# Patient Record
Sex: Female | Born: 1940
Health system: Southern US, Community
[De-identification: ages and names within clinical notes are randomized; demographics above are authoritative.]

## PROBLEM LIST (undated history)

## (undated) DIAGNOSIS — M1990 Unspecified osteoarthritis, unspecified site: Secondary | ICD-10-CM

## (undated) DIAGNOSIS — K8020 Calculus of gallbladder without cholecystitis without obstruction: Secondary | ICD-10-CM

## (undated) DIAGNOSIS — R319 Hematuria, unspecified: Secondary | ICD-10-CM

## (undated) DIAGNOSIS — Z860100 Personal history of colon polyps, unspecified: Secondary | ICD-10-CM

## (undated) DIAGNOSIS — K5790 Diverticulosis of intestine, part unspecified, without perforation or abscess without bleeding: Secondary | ICD-10-CM

## (undated) DIAGNOSIS — T7840XA Allergy, unspecified, initial encounter: Secondary | ICD-10-CM

## (undated) DIAGNOSIS — R112 Nausea with vomiting, unspecified: Secondary | ICD-10-CM

## (undated) DIAGNOSIS — H269 Unspecified cataract: Secondary | ICD-10-CM

## (undated) DIAGNOSIS — I10 Essential (primary) hypertension: Secondary | ICD-10-CM

## (undated) DIAGNOSIS — E041 Nontoxic single thyroid nodule: Secondary | ICD-10-CM

## (undated) DIAGNOSIS — Z9889 Other specified postprocedural states: Secondary | ICD-10-CM

## (undated) HISTORY — PX: TONSILLECTOMY: SUR1361

## (undated) HISTORY — DX: Unspecified cataract: H26.9

## (undated) HISTORY — PX: COLONOSCOPY: SHX174

## (undated) HISTORY — DX: Personal history of colon polyps, unspecified: Z86.0100

## (undated) HISTORY — PX: PARTIAL HYSTERECTOMY: SHX80

## (undated) HISTORY — DX: Calculus of gallbladder without cholecystitis without obstruction: K80.20

## (undated) HISTORY — DX: Nausea with vomiting, unspecified: R11.2

## (undated) HISTORY — DX: Diverticulosis of intestine, part unspecified, without perforation or abscess without bleeding: K57.90

## (undated) HISTORY — DX: Essential (primary) hypertension: I10

## (undated) HISTORY — PX: POLYPECTOMY: SHX149

## (undated) HISTORY — DX: Hematuria, unspecified: R31.9

## (undated) HISTORY — DX: Unspecified osteoarthritis, unspecified site: M19.90

## (undated) HISTORY — DX: Other specified postprocedural states: Z98.890

## (undated) HISTORY — DX: Allergy, unspecified, initial encounter: T78.40XA

## (undated) HISTORY — DX: Nontoxic single thyroid nodule: E04.1

## (undated) HISTORY — DX: Personal history of colonic polyps: Z86.010

## (undated) HISTORY — PX: TUBAL LIGATION: SHX77

---

## 1998-08-09 ENCOUNTER — Ambulatory Visit (HOSPITAL_COMMUNITY): Admission: RE | Admit: 1998-08-09 | Discharge: 1998-08-09 | Payer: Self-pay | Admitting: Family Medicine

## 1998-08-09 ENCOUNTER — Encounter: Payer: Self-pay | Admitting: Family Medicine

## 1999-08-16 ENCOUNTER — Encounter: Payer: Self-pay | Admitting: Family Medicine

## 1999-08-16 ENCOUNTER — Ambulatory Visit (HOSPITAL_COMMUNITY): Admission: RE | Admit: 1999-08-16 | Discharge: 1999-08-16 | Payer: Self-pay | Admitting: Family Medicine

## 2000-11-04 ENCOUNTER — Encounter: Payer: Self-pay | Admitting: Family Medicine

## 2000-11-04 ENCOUNTER — Ambulatory Visit (HOSPITAL_COMMUNITY): Admission: RE | Admit: 2000-11-04 | Discharge: 2000-11-04 | Payer: Self-pay | Admitting: Family Medicine

## 2001-11-17 ENCOUNTER — Encounter: Payer: Self-pay | Admitting: Internal Medicine

## 2001-11-17 ENCOUNTER — Ambulatory Visit (HOSPITAL_COMMUNITY): Admission: RE | Admit: 2001-11-17 | Discharge: 2001-11-17 | Payer: Self-pay | Admitting: Internal Medicine

## 2002-11-23 ENCOUNTER — Ambulatory Visit (HOSPITAL_COMMUNITY): Admission: RE | Admit: 2002-11-23 | Discharge: 2002-11-23 | Payer: Self-pay | Admitting: Family Medicine

## 2003-07-19 ENCOUNTER — Other Ambulatory Visit: Admission: RE | Admit: 2003-07-19 | Discharge: 2003-07-19 | Payer: Self-pay | Admitting: Obstetrics and Gynecology

## 2003-10-15 ENCOUNTER — Encounter (INDEPENDENT_AMBULATORY_CARE_PROVIDER_SITE_OTHER): Payer: Self-pay | Admitting: Specialist

## 2003-10-15 ENCOUNTER — Ambulatory Visit (HOSPITAL_COMMUNITY): Admission: RE | Admit: 2003-10-15 | Discharge: 2003-10-15 | Payer: Self-pay | Admitting: Gastroenterology

## 2004-01-05 ENCOUNTER — Ambulatory Visit (HOSPITAL_COMMUNITY): Admission: RE | Admit: 2004-01-05 | Discharge: 2004-01-05 | Payer: Self-pay | Admitting: Obstetrics and Gynecology

## 2004-11-14 ENCOUNTER — Other Ambulatory Visit: Admission: RE | Admit: 2004-11-14 | Discharge: 2004-11-14 | Payer: Self-pay | Admitting: Obstetrics and Gynecology

## 2005-02-05 ENCOUNTER — Ambulatory Visit (HOSPITAL_COMMUNITY): Admission: RE | Admit: 2005-02-05 | Discharge: 2005-02-05 | Payer: Self-pay | Admitting: Obstetrics and Gynecology

## 2006-03-04 ENCOUNTER — Ambulatory Visit (HOSPITAL_COMMUNITY): Admission: RE | Admit: 2006-03-04 | Discharge: 2006-03-04 | Payer: Self-pay | Admitting: Allergy

## 2007-06-04 ENCOUNTER — Ambulatory Visit (HOSPITAL_COMMUNITY): Admission: RE | Admit: 2007-06-04 | Discharge: 2007-06-04 | Payer: Self-pay | Admitting: Allergy

## 2010-06-09 NOTE — Op Note (Signed)
Sheryl Flores, SHEARMAN NO.:  [id number]   MEDICAL RECORD NO.:  [id number]          PATIENT TYPE:  AMB   LOCATION:  ENDO                         FACILITY:  [facility]   PHYSICIAN:  Petra Kuba, M.D.    DATE OF BIRTH:  11/16/1940   DATE OF PROCEDURE:  10/15/2003  DATE OF DISCHARGE:                                 OPERATIVE REPORT   PROCEDURE:  Colonoscopy with polypectomy.   INDICATION:  The patient with a family history of colon cancer in the  maternal aunt.  Her mom with some sort of abdominal cancer.  Consent was  signed after risks, benefits, methods, and options thoroughly discussed in  the office.   MEDICINES USED:  1.  Demerol 60.  2.  Versed 6.   DESCRIPTION OF PROCEDURE:  Rectal inspection was pertinent for external  hemorrhoids, small.  Digital exam was negative.  Video pediatric adjustable  colonoscope was inserted and with abdominal pressure, easily able to be  advanced around the colon to the cecum.  This did require abdominal  pressure, as mentioned above.  Prep was adequate.  On insertion, some left-  sided diverticula were seen but no other abnormalities.  The cecum was  identified by the appendiceal orifice.  In fact, the scope was inserted a  short ways in the terminal ileum which was normal.  Photodocumentation was  obtained.  The scope was slowly withdrawn.  Prep was adequate.  There was  some liquid stool that required washing and suctioning.  The cecum and the  ascending were normal.  In the more proximal transverse, a tiny polyp was  seen and was hot biopsied x 1; the scope was further withdrawn.  Other than  the left-sided moderate diverticula, no abnormalities were seen.  Anorectal  pull-through and retroflexion did confirm some small hemorrhoids.  The scope  was reinserted a short ways up the left side of the colon; air was suctioned  and scope removed.  The patient tolerated the procedure well.  There was no  obvious immediate  complication.   ENDOSCOPIC DIAGNOSES:  1.  Internal-external hemorrhoids.  2.  Left-sided moderate diverticula.  3.  Proximal tiny transverse polyp, hot biopsied.  4.  Otherwise, within normal limits to the terminal ileum.   PLAN:  1.  Await pathology to determine future colonic screening.  2.  Probably recheck in 5 years.  3.  Happy to see back p.r.n.  4.  Otherwise, return care to Dr. Dareen Piano for the customary health care      maintenance to include yearly rectals and guaiacs.      MEM/MEDQ  D:  10/15/2003  T:  10/15/2003  Job:  [id number]   cc:   Malva Limes, M.D.  [address], [address]  [place]  [place] [address]  Fax: [phone]

## 2012-10-13 ENCOUNTER — Other Ambulatory Visit: Payer: Self-pay | Admitting: Urology

## 2012-10-13 ENCOUNTER — Ambulatory Visit
Admission: RE | Admit: 2012-10-13 | Discharge: 2012-10-13 | Disposition: A | Discharge status: Home / Self Care | Payer: Medicare Other | Source: Ambulatory Visit | Attending: Urology | Admitting: Urology

## 2012-10-13 DIAGNOSIS — R319 Hematuria, unspecified: Secondary | ICD-10-CM

## 2015-03-18 DIAGNOSIS — J019 Acute sinusitis, unspecified: Secondary | ICD-10-CM | POA: Diagnosis not present

## 2015-03-18 DIAGNOSIS — J302 Other seasonal allergic rhinitis: Secondary | ICD-10-CM | POA: Diagnosis not present

## 2015-03-18 DIAGNOSIS — H6123 Impacted cerumen, bilateral: Secondary | ICD-10-CM | POA: Diagnosis not present

## 2015-03-18 DIAGNOSIS — H6983 Other specified disorders of Eustachian tube, bilateral: Secondary | ICD-10-CM | POA: Diagnosis not present

## 2015-05-12 DIAGNOSIS — Z0000 Encounter for general adult medical examination without abnormal findings: Secondary | ICD-10-CM | POA: Diagnosis not present

## 2015-05-12 DIAGNOSIS — I10 Essential (primary) hypertension: Secondary | ICD-10-CM | POA: Diagnosis not present

## 2015-05-12 DIAGNOSIS — E042 Nontoxic multinodular goiter: Secondary | ICD-10-CM | POA: Diagnosis not present

## 2015-05-12 DIAGNOSIS — R030 Elevated blood-pressure reading, without diagnosis of hypertension: Secondary | ICD-10-CM | POA: Diagnosis not present

## 2015-05-12 DIAGNOSIS — Z1389 Encounter for screening for other disorder: Secondary | ICD-10-CM | POA: Diagnosis not present

## 2015-06-02 DIAGNOSIS — Z79899 Other long term (current) drug therapy: Secondary | ICD-10-CM | POA: Diagnosis not present

## 2015-06-02 DIAGNOSIS — I10 Essential (primary) hypertension: Secondary | ICD-10-CM | POA: Diagnosis not present

## 2015-06-21 DIAGNOSIS — J0100 Acute maxillary sinusitis, unspecified: Secondary | ICD-10-CM | POA: Diagnosis not present

## 2015-06-21 DIAGNOSIS — J40 Bronchitis, not specified as acute or chronic: Secondary | ICD-10-CM | POA: Diagnosis not present

## 2015-07-04 DIAGNOSIS — Z79899 Other long term (current) drug therapy: Secondary | ICD-10-CM | POA: Diagnosis not present

## 2015-07-04 DIAGNOSIS — J029 Acute pharyngitis, unspecified: Secondary | ICD-10-CM | POA: Diagnosis not present

## 2015-07-04 DIAGNOSIS — I10 Essential (primary) hypertension: Secondary | ICD-10-CM | POA: Diagnosis not present

## 2015-07-05 DIAGNOSIS — D485 Neoplasm of uncertain behavior of skin: Secondary | ICD-10-CM | POA: Diagnosis not present

## 2015-07-05 DIAGNOSIS — L820 Inflamed seborrheic keratosis: Secondary | ICD-10-CM | POA: Diagnosis not present

## 2015-07-05 DIAGNOSIS — D225 Melanocytic nevi of trunk: Secondary | ICD-10-CM | POA: Diagnosis not present

## 2015-07-19 DIAGNOSIS — D485 Neoplasm of uncertain behavior of skin: Secondary | ICD-10-CM | POA: Diagnosis not present

## 2015-08-16 DIAGNOSIS — Z79899 Other long term (current) drug therapy: Secondary | ICD-10-CM | POA: Diagnosis not present

## 2015-09-13 DIAGNOSIS — E876 Hypokalemia: Secondary | ICD-10-CM | POA: Diagnosis not present

## 2015-09-23 DIAGNOSIS — I10 Essential (primary) hypertension: Secondary | ICD-10-CM | POA: Diagnosis not present

## 2015-09-23 DIAGNOSIS — E876 Hypokalemia: Secondary | ICD-10-CM | POA: Diagnosis not present

## 2015-09-23 DIAGNOSIS — H6123 Impacted cerumen, bilateral: Secondary | ICD-10-CM | POA: Diagnosis not present

## 2015-10-03 DIAGNOSIS — L821 Other seborrheic keratosis: Secondary | ICD-10-CM | POA: Diagnosis not present

## 2015-10-03 DIAGNOSIS — D1801 Hemangioma of skin and subcutaneous tissue: Secondary | ICD-10-CM | POA: Diagnosis not present

## 2015-10-13 DIAGNOSIS — Z79899 Other long term (current) drug therapy: Secondary | ICD-10-CM | POA: Diagnosis not present

## 2015-11-03 DIAGNOSIS — I10 Essential (primary) hypertension: Secondary | ICD-10-CM | POA: Diagnosis not present

## 2015-11-03 DIAGNOSIS — Z79899 Other long term (current) drug therapy: Secondary | ICD-10-CM | POA: Diagnosis not present

## 2015-12-01 DIAGNOSIS — Z1231 Encounter for screening mammogram for malignant neoplasm of breast: Secondary | ICD-10-CM | POA: Diagnosis not present

## 2016-01-25 DIAGNOSIS — H2513 Age-related nuclear cataract, bilateral: Secondary | ICD-10-CM | POA: Diagnosis not present

## 2016-01-25 DIAGNOSIS — H524 Presbyopia: Secondary | ICD-10-CM | POA: Diagnosis not present

## 2016-02-02 DIAGNOSIS — J0100 Acute maxillary sinusitis, unspecified: Secondary | ICD-10-CM | POA: Diagnosis not present

## 2016-02-02 DIAGNOSIS — I10 Essential (primary) hypertension: Secondary | ICD-10-CM | POA: Diagnosis not present

## 2016-02-02 DIAGNOSIS — Z79899 Other long term (current) drug therapy: Secondary | ICD-10-CM | POA: Diagnosis not present

## 2016-05-08 DIAGNOSIS — L820 Inflamed seborrheic keratosis: Secondary | ICD-10-CM | POA: Diagnosis not present

## 2016-06-06 DIAGNOSIS — Z1389 Encounter for screening for other disorder: Secondary | ICD-10-CM | POA: Diagnosis not present

## 2016-06-06 DIAGNOSIS — E2839 Other primary ovarian failure: Secondary | ICD-10-CM | POA: Diagnosis not present

## 2016-06-06 DIAGNOSIS — Z23 Encounter for immunization: Secondary | ICD-10-CM | POA: Diagnosis not present

## 2016-06-06 DIAGNOSIS — Z0000 Encounter for general adult medical examination without abnormal findings: Secondary | ICD-10-CM | POA: Diagnosis not present

## 2016-06-12 DIAGNOSIS — E042 Nontoxic multinodular goiter: Secondary | ICD-10-CM | POA: Diagnosis not present

## 2016-06-12 DIAGNOSIS — Z79899 Other long term (current) drug therapy: Secondary | ICD-10-CM | POA: Diagnosis not present

## 2016-06-12 DIAGNOSIS — I10 Essential (primary) hypertension: Secondary | ICD-10-CM | POA: Diagnosis not present

## 2016-06-12 DIAGNOSIS — E781 Pure hyperglyceridemia: Secondary | ICD-10-CM | POA: Diagnosis not present

## 2016-06-29 DIAGNOSIS — R300 Dysuria: Secondary | ICD-10-CM | POA: Diagnosis not present

## 2016-07-02 DIAGNOSIS — R3121 Asymptomatic microscopic hematuria: Secondary | ICD-10-CM | POA: Diagnosis not present

## 2016-07-06 DIAGNOSIS — R945 Abnormal results of liver function studies: Secondary | ICD-10-CM | POA: Diagnosis not present

## 2016-07-06 DIAGNOSIS — R748 Abnormal levels of other serum enzymes: Secondary | ICD-10-CM | POA: Diagnosis not present

## 2016-07-10 DIAGNOSIS — R3121 Asymptomatic microscopic hematuria: Secondary | ICD-10-CM | POA: Diagnosis not present

## 2016-07-10 DIAGNOSIS — N281 Cyst of kidney, acquired: Secondary | ICD-10-CM | POA: Diagnosis not present

## 2016-07-13 DIAGNOSIS — N281 Cyst of kidney, acquired: Secondary | ICD-10-CM | POA: Diagnosis not present

## 2016-07-13 DIAGNOSIS — R3121 Asymptomatic microscopic hematuria: Secondary | ICD-10-CM | POA: Diagnosis not present

## 2016-07-30 DIAGNOSIS — Z79899 Other long term (current) drug therapy: Secondary | ICD-10-CM | POA: Diagnosis not present

## 2016-08-03 DIAGNOSIS — J0140 Acute pansinusitis, unspecified: Secondary | ICD-10-CM | POA: Diagnosis not present

## 2016-08-24 DIAGNOSIS — R198 Other specified symptoms and signs involving the digestive system and abdomen: Secondary | ICD-10-CM | POA: Diagnosis not present

## 2016-08-24 DIAGNOSIS — K760 Fatty (change of) liver, not elsewhere classified: Secondary | ICD-10-CM | POA: Diagnosis not present

## 2016-08-24 DIAGNOSIS — J069 Acute upper respiratory infection, unspecified: Secondary | ICD-10-CM | POA: Diagnosis not present

## 2016-11-28 DIAGNOSIS — H6123 Impacted cerumen, bilateral: Secondary | ICD-10-CM | POA: Diagnosis not present

## 2016-11-28 DIAGNOSIS — Z79899 Other long term (current) drug therapy: Secondary | ICD-10-CM | POA: Diagnosis not present

## 2016-11-28 DIAGNOSIS — E876 Hypokalemia: Secondary | ICD-10-CM | POA: Diagnosis not present

## 2016-11-28 DIAGNOSIS — I10 Essential (primary) hypertension: Secondary | ICD-10-CM | POA: Diagnosis not present

## 2016-12-11 DIAGNOSIS — J0100 Acute maxillary sinusitis, unspecified: Secondary | ICD-10-CM | POA: Diagnosis not present

## 2016-12-18 DIAGNOSIS — L820 Inflamed seborrheic keratosis: Secondary | ICD-10-CM | POA: Diagnosis not present

## 2017-01-25 DIAGNOSIS — J324 Chronic pansinusitis: Secondary | ICD-10-CM | POA: Diagnosis not present

## 2017-02-12 DIAGNOSIS — R05 Cough: Secondary | ICD-10-CM | POA: Diagnosis not present

## 2017-02-25 DIAGNOSIS — H52223 Regular astigmatism, bilateral: Secondary | ICD-10-CM | POA: Diagnosis not present

## 2017-02-25 DIAGNOSIS — H2513 Age-related nuclear cataract, bilateral: Secondary | ICD-10-CM | POA: Diagnosis not present

## 2017-03-04 DIAGNOSIS — R05 Cough: Secondary | ICD-10-CM | POA: Diagnosis not present

## 2017-03-04 DIAGNOSIS — E042 Nontoxic multinodular goiter: Secondary | ICD-10-CM | POA: Diagnosis not present

## 2017-03-04 DIAGNOSIS — Z79899 Other long term (current) drug therapy: Secondary | ICD-10-CM | POA: Diagnosis not present

## 2017-03-04 DIAGNOSIS — H6123 Impacted cerumen, bilateral: Secondary | ICD-10-CM | POA: Diagnosis not present

## 2017-03-04 DIAGNOSIS — R0989 Other specified symptoms and signs involving the circulatory and respiratory systems: Secondary | ICD-10-CM | POA: Diagnosis not present

## 2017-03-04 DIAGNOSIS — R0982 Postnasal drip: Secondary | ICD-10-CM | POA: Diagnosis not present

## 2017-03-04 DIAGNOSIS — K760 Fatty (change of) liver, not elsewhere classified: Secondary | ICD-10-CM | POA: Diagnosis not present

## 2017-03-04 DIAGNOSIS — I10 Essential (primary) hypertension: Secondary | ICD-10-CM | POA: Diagnosis not present

## 2017-03-04 DIAGNOSIS — E781 Pure hyperglyceridemia: Secondary | ICD-10-CM | POA: Diagnosis not present

## 2017-04-03 ENCOUNTER — Encounter: Payer: Self-pay | Admitting: Gastroenterology

## 2017-04-16 DIAGNOSIS — Z1231 Encounter for screening mammogram for malignant neoplasm of breast: Secondary | ICD-10-CM | POA: Diagnosis not present

## 2017-04-24 ENCOUNTER — Other Ambulatory Visit: Payer: Self-pay

## 2017-04-24 ENCOUNTER — Ambulatory Visit (AMBULATORY_SURGERY_CENTER): Payer: Self-pay | Admitting: *Deleted

## 2017-04-24 VITALS — Ht 63.5 in | Wt 146.0 lb

## 2017-04-24 DIAGNOSIS — Z86010 Personal history of colonic polyps: Secondary | ICD-10-CM

## 2017-04-24 MED ORDER — SOD PICOSULFATE-MAG OX-CIT ACD 10-3.5-12 MG-GM -GM/160ML PO SOLN: 2.0000 | ORAL | 0 refills | Status: DC

## 2017-04-24 NOTE — Progress Notes (Signed)
No egg or soy allergy known to patient  No issues with past sedation with any surgeries  or procedures, no intubation problems  No diet pills per patient No home 02 use per patient  No blood thinners per patient  Pt denies issues with constipation  No A fib or A flutter  EMMI video sent to pt's e mail =- pt declined  Clenpiq sample  Lot [id number] exp 05-2017 as directed

## 2017-04-26 ENCOUNTER — Encounter: Payer: Self-pay | Admitting: Gastroenterology

## 2017-05-08 ENCOUNTER — Ambulatory Visit (AMBULATORY_SURGERY_CENTER): Payer: PPO | Admitting: Gastroenterology

## 2017-05-08 ENCOUNTER — Encounter: Payer: Self-pay | Admitting: Gastroenterology

## 2017-05-08 ENCOUNTER — Other Ambulatory Visit: Payer: Self-pay

## 2017-05-08 VITALS — BP 136/76 | HR 78 | Temp 99.1°F | Resp 16 | Ht 63.0 in | Wt 146.0 lb

## 2017-05-08 DIAGNOSIS — Z86010 Personal history of colonic polyps: Secondary | ICD-10-CM

## 2017-05-08 DIAGNOSIS — I10 Essential (primary) hypertension: Secondary | ICD-10-CM | POA: Diagnosis not present

## 2017-05-08 MED ORDER — SODIUM CHLORIDE 0.9 % IV SOLN: 500.0000 mL | Freq: Once | INTRAVENOUS | Status: AC

## 2017-05-08 NOTE — Progress Notes (Signed)
Report to PACU, RN, vss, BBS= Clear.  

## 2017-05-08 NOTE — Progress Notes (Signed)
Pt's states no medical or surgical changes since previsit or office visit. 

## 2017-05-08 NOTE — Op Note (Signed)
[facility] Patient Name: Sheryl Flores Procedure Date: 05/08/2017 11:16 AM MRN: [id number] Endoscopist: Jackquline Denmark MD, MD Age: 77 Referring MD:  Date of Birth: December 30, 1940 Gender: Female Account #: [id number] Procedure:                Colonoscopy Indications:              Personal history of colonic polyps Medicines:                Monitored Anesthesia Care Procedure:                Pre-Anesthesia Assessment:                           - Prior to the procedure, a History and Physical                            was performed, and patient medications and                            allergies were reviewed. The patient is competent.                            The risks and benefits of the procedure and the                            sedation options and risks were discussed with the                            patient. All questions were answered and informed                            consent was obtained. Patient identification and                            proposed procedure were verified by the physician                            in the procedure room. Mental Status Examination:                            alert and oriented. Prophylactic Antibiotics: The                            patient does not require prophylactic antibiotics.                            Prior Anticoagulants: The patient has taken no                            previous anticoagulant or antiplatelet agents. ASA                            Grade Assessment: II - A patient with mild systemic  disease. After reviewing the risks and benefits,                            the patient was deemed in satisfactory condition to                            undergo the procedure. The anesthesia plan was to                            use monitored anesthesia care (MAC). Immediately                            prior to administration of medications, the patient                            was  re-assessed for adequacy to receive sedatives.                            The heart rate, respiratory rate, oxygen                            saturations, blood pressure, adequacy of pulmonary                            ventilation, and response to care were monitored                            throughout the procedure. The physical status of                            the patient was re-assessed after the procedure.                           After obtaining informed consent, the colonoscope                            was passed under direct vision. Throughout the                            procedure, the patient's blood pressure, pulse, and                            oxygen saturations were monitored continuously. The                            Model [id number]-[id number]) scope was introduced                            through the anus and advanced to the 2 cm into the                            ileum. The colonoscopy was performed without  difficulty. The patient tolerated the procedure                            well. The quality of the bowel preparation was good. Scope In: 11:27:14 AM Scope Out: 93:81:82 AM Scope Withdrawal Time: 0 hours 11 minutes 49 seconds  Total Procedure Duration: 0 hours 18 minutes 53 seconds  Findings:                 Hemorrhoids were found on perianal exam.                           Multiple small and large-mouthed diverticula were                            found in the sigmoid colon and descending colon.                            Few scattered diverticula in the ascending colon.                           The exam was otherwise without abnormality on                            direct and retroflexion views. Complications:            No immediate complications. Estimated Blood Loss:     Estimated blood loss: none. Impression:               - Moderate to severe predominantly left colonic                            diverticulosis.                            - Otherwise normal colonoscopy to terminal ileum. Recommendation:           - Patient has a contact number available for                            emergencies. The signs and symptoms of potential                            delayed complications were discussed with the                            patient. Return to normal activities tomorrow.                            Written discharge instructions were provided to the                            patient.                           - High fiber diet.                           -  Continue present medications. Patient will get in                            touch with [place] in case she starts having any                            anorectal problems in the future.                           - Repeat colonoscopy in 10 years for surveillance,                            as per current recommendations. Earlier, if she                            starts having any new lower GI problems or if there                            is any change in family history. Jackquline Denmark MD, MD 05/08/2017 11:57:20 AM This report has been signed electronically.

## 2017-05-08 NOTE — Patient Instructions (Signed)
*  Handouts given on diverticulosis and hemorrhoids.  YOU HAD AN ENDOSCOPIC PROCEDURE TODAY AT THE [facility]:   Refer to the procedure report that was given to you for any specific questions about what was found during the examination.  If the procedure report does not answer your questions, please call your gastroenterologist to clarify.  If you requested that your care partner not be given the details of your procedure findings, then the procedure report has been included in a sealed envelope for you to review at your convenience later.  YOU SHOULD EXPECT: Some feelings of bloating in the abdomen. Passage of more gas than usual.  Walking can help get rid of the air that was put into your GI tract during the procedure and reduce the bloating. If you had a lower endoscopy (such as a colonoscopy or flexible sigmoidoscopy) you may notice spotting of blood in your stool or on the toilet paper. If you underwent a bowel prep for your procedure, you may not have a normal bowel movement for a few days.  Please Note:  You might notice some irritation and congestion in your nose or some drainage.  This is from the oxygen used during your procedure.  There is no need for concern and it should clear up in a day or so.  SYMPTOMS TO REPORT IMMEDIATELY:   Following lower endoscopy (colonoscopy or flexible sigmoidoscopy):  Excessive amounts of blood in the stool  Significant tenderness or worsening of abdominal pains  Swelling of the abdomen that is new, acute  Fever of 100F or higher   For urgent or emergent issues, a gastroenterologist can be reached at any hour by calling [phone].   DIET:  We do recommend a small meal at first, but then you may proceed to your regular diet.  Drink plenty of fluids but you should avoid alcoholic beverages for 24 hours.  ACTIVITY:  You should plan to take it easy for the rest of today and you should NOT DRIVE or use heavy machinery until tomorrow  (because of the sedation medicines used during the test).    FOLLOW UP: Our staff will call the number listed on your records the next business day following your procedure to check on you and address any questions or concerns that you may have regarding the information given to you following your procedure. If we do not reach you, we will leave a message.  However, if you are feeling well and you are not experiencing any problems, there is no need to return our call.  We will assume that you have returned to your regular daily activities without incident.  If any biopsies were taken you will be contacted by phone or by letter within the next 1-3 weeks.  Please call us at [phone] if you have not heard about the biopsies in 3 weeks.    SIGNATURES/CONFIDENTIALITY: You and/or your care partner have signed paperwork which will be entered into your electronic medical record.  These signatures attest to the fact that that the information above on your After Visit Summary has been reviewed and is understood.  Full responsibility of the confidentiality of this discharge information lies with you and/or your care-partner. 

## 2017-05-09 ENCOUNTER — Telehealth: Payer: Self-pay | Admitting: *Deleted

## 2017-05-09 NOTE — Telephone Encounter (Signed)
  Follow up Call-  Call back number 05/08/2017  Post procedure Call Back phone  # [phone]  Permission to leave phone message Yes  Some recent data might be hidden     Patient questions:  Do you have a fever, pain , or abdominal swelling? No. Pain Score  0 *  Have you tolerated food without any problems? Yes.    Have you been able to return to your normal activities? Yes.    Do you have any questions about your discharge instructions: Diet   No. Medications  No. Follow up visit  No.  Do you have questions or concerns about your Care? No.  Actions: * If pain score is 4 or above: No action needed, pain <4.

## 2017-06-18 DIAGNOSIS — D485 Neoplasm of uncertain behavior of skin: Secondary | ICD-10-CM | POA: Diagnosis not present

## 2017-06-18 DIAGNOSIS — L820 Inflamed seborrheic keratosis: Secondary | ICD-10-CM | POA: Diagnosis not present

## 2017-06-28 DIAGNOSIS — E2839 Other primary ovarian failure: Secondary | ICD-10-CM | POA: Diagnosis not present

## 2017-06-28 DIAGNOSIS — Z23 Encounter for immunization: Secondary | ICD-10-CM | POA: Diagnosis not present

## 2017-06-28 DIAGNOSIS — Z1389 Encounter for screening for other disorder: Secondary | ICD-10-CM | POA: Diagnosis not present

## 2017-06-28 DIAGNOSIS — Z0000 Encounter for general adult medical examination without abnormal findings: Secondary | ICD-10-CM | POA: Diagnosis not present

## 2017-06-28 DIAGNOSIS — Z1331 Encounter for screening for depression: Secondary | ICD-10-CM | POA: Diagnosis not present

## 2017-07-30 DIAGNOSIS — R21 Rash and other nonspecific skin eruption: Secondary | ICD-10-CM | POA: Diagnosis not present

## 2017-07-31 DIAGNOSIS — N200 Calculus of kidney: Secondary | ICD-10-CM | POA: Diagnosis not present

## 2017-07-31 DIAGNOSIS — R3121 Asymptomatic microscopic hematuria: Secondary | ICD-10-CM | POA: Diagnosis not present

## 2017-08-01 DIAGNOSIS — R21 Rash and other nonspecific skin eruption: Secondary | ICD-10-CM | POA: Diagnosis not present

## 2017-08-01 DIAGNOSIS — L299 Pruritus, unspecified: Secondary | ICD-10-CM | POA: Diagnosis not present

## 2017-08-20 DIAGNOSIS — R748 Abnormal levels of other serum enzymes: Secondary | ICD-10-CM | POA: Diagnosis not present

## 2017-08-20 DIAGNOSIS — D72829 Elevated white blood cell count, unspecified: Secondary | ICD-10-CM | POA: Diagnosis not present

## 2017-08-26 DIAGNOSIS — L300 Nummular dermatitis: Secondary | ICD-10-CM | POA: Diagnosis not present

## 2017-08-26 DIAGNOSIS — L299 Pruritus, unspecified: Secondary | ICD-10-CM | POA: Diagnosis not present

## 2017-08-26 DIAGNOSIS — D485 Neoplasm of uncertain behavior of skin: Secondary | ICD-10-CM | POA: Diagnosis not present

## 2017-09-17 DIAGNOSIS — Z6824 Body mass index (BMI) 24.0-24.9, adult: Secondary | ICD-10-CM | POA: Diagnosis not present

## 2017-09-17 DIAGNOSIS — R350 Frequency of micturition: Secondary | ICD-10-CM | POA: Diagnosis not present

## 2017-10-11 DIAGNOSIS — N289 Disorder of kidney and ureter, unspecified: Secondary | ICD-10-CM | POA: Diagnosis not present

## 2017-10-11 DIAGNOSIS — K862 Cyst of pancreas: Secondary | ICD-10-CM | POA: Diagnosis not present

## 2017-10-11 DIAGNOSIS — K8020 Calculus of gallbladder without cholecystitis without obstruction: Secondary | ICD-10-CM | POA: Diagnosis not present

## 2017-10-11 DIAGNOSIS — Q453 Other congenital malformations of pancreas and pancreatic duct: Secondary | ICD-10-CM | POA: Diagnosis not present

## 2017-11-06 DIAGNOSIS — Z6823 Body mass index (BMI) 23.0-23.9, adult: Secondary | ICD-10-CM | POA: Diagnosis not present

## 2017-11-06 DIAGNOSIS — J019 Acute sinusitis, unspecified: Secondary | ICD-10-CM | POA: Diagnosis not present

## 2017-12-12 DIAGNOSIS — D485 Neoplasm of uncertain behavior of skin: Secondary | ICD-10-CM | POA: Diagnosis not present

## 2017-12-31 ENCOUNTER — Other Ambulatory Visit: Payer: Self-pay | Admitting: Urology

## 2017-12-31 DIAGNOSIS — K8020 Calculus of gallbladder without cholecystitis without obstruction: Secondary | ICD-10-CM | POA: Diagnosis not present

## 2017-12-31 DIAGNOSIS — Z79899 Other long term (current) drug therapy: Secondary | ICD-10-CM | POA: Diagnosis not present

## 2017-12-31 DIAGNOSIS — Z6823 Body mass index (BMI) 23.0-23.9, adult: Secondary | ICD-10-CM | POA: Diagnosis not present

## 2017-12-31 DIAGNOSIS — I10 Essential (primary) hypertension: Secondary | ICD-10-CM | POA: Diagnosis not present

## 2017-12-31 DIAGNOSIS — N281 Cyst of kidney, acquired: Secondary | ICD-10-CM

## 2017-12-31 DIAGNOSIS — J0100 Acute maxillary sinusitis, unspecified: Secondary | ICD-10-CM | POA: Diagnosis not present

## 2017-12-31 DIAGNOSIS — E781 Pure hyperglyceridemia: Secondary | ICD-10-CM | POA: Diagnosis not present

## 2018-01-28 DIAGNOSIS — Z6823 Body mass index (BMI) 23.0-23.9, adult: Secondary | ICD-10-CM | POA: Diagnosis not present

## 2018-01-28 DIAGNOSIS — I10 Essential (primary) hypertension: Secondary | ICD-10-CM | POA: Diagnosis not present

## 2018-02-03 ENCOUNTER — Other Ambulatory Visit: Payer: Self-pay | Admitting: Urology

## 2018-02-05 ENCOUNTER — Ambulatory Visit
Admission: RE | Admit: 2018-02-05 | Discharge: 2018-02-05 | Disposition: A | Discharge status: Home / Self Care | Payer: PPO | Source: Ambulatory Visit | Attending: Urology | Admitting: Urology

## 2018-02-05 DIAGNOSIS — N281 Cyst of kidney, acquired: Secondary | ICD-10-CM

## 2018-02-05 MED ORDER — GADOBENATE DIMEGLUMINE 529 MG/ML IV SOLN
12.0000 mL | Freq: Once | INTRAVENOUS | Status: AC | PRN
  Administered 2018-02-05: 12 mL via INTRAVENOUS

## 2018-02-20 DIAGNOSIS — N281 Cyst of kidney, acquired: Secondary | ICD-10-CM | POA: Diagnosis not present

## 2018-02-20 DIAGNOSIS — K862 Cyst of pancreas: Secondary | ICD-10-CM | POA: Diagnosis not present

## 2018-02-20 DIAGNOSIS — N200 Calculus of kidney: Secondary | ICD-10-CM | POA: Diagnosis not present

## 2018-02-20 DIAGNOSIS — R3121 Asymptomatic microscopic hematuria: Secondary | ICD-10-CM | POA: Diagnosis not present

## 2018-03-18 DIAGNOSIS — D485 Neoplasm of uncertain behavior of skin: Secondary | ICD-10-CM | POA: Diagnosis not present

## 2018-03-18 DIAGNOSIS — L820 Inflamed seborrheic keratosis: Secondary | ICD-10-CM | POA: Diagnosis not present

## 2018-04-03 DIAGNOSIS — Z1331 Encounter for screening for depression: Secondary | ICD-10-CM | POA: Diagnosis not present

## 2018-04-03 DIAGNOSIS — Z6823 Body mass index (BMI) 23.0-23.9, adult: Secondary | ICD-10-CM | POA: Diagnosis not present

## 2018-04-03 DIAGNOSIS — Z0000 Encounter for general adult medical examination without abnormal findings: Secondary | ICD-10-CM | POA: Diagnosis not present

## 2018-04-03 DIAGNOSIS — Z1339 Encounter for screening examination for other mental health and behavioral disorders: Secondary | ICD-10-CM | POA: Diagnosis not present

## 2018-04-03 DIAGNOSIS — E2839 Other primary ovarian failure: Secondary | ICD-10-CM | POA: Diagnosis not present

## 2018-04-03 DIAGNOSIS — Z1231 Encounter for screening mammogram for malignant neoplasm of breast: Secondary | ICD-10-CM | POA: Diagnosis not present

## 2018-06-09 DIAGNOSIS — R300 Dysuria: Secondary | ICD-10-CM | POA: Diagnosis not present

## 2018-06-09 DIAGNOSIS — H6122 Impacted cerumen, left ear: Secondary | ICD-10-CM | POA: Diagnosis not present

## 2018-06-09 DIAGNOSIS — Z6823 Body mass index (BMI) 23.0-23.9, adult: Secondary | ICD-10-CM | POA: Diagnosis not present

## 2018-08-22 ENCOUNTER — Other Ambulatory Visit: Payer: Self-pay

## 2018-09-08 DIAGNOSIS — L820 Inflamed seborrheic keratosis: Secondary | ICD-10-CM | POA: Diagnosis not present

## 2018-09-08 DIAGNOSIS — C44319 Basal cell carcinoma of skin of other parts of face: Secondary | ICD-10-CM | POA: Diagnosis not present

## 2018-09-08 DIAGNOSIS — D485 Neoplasm of uncertain behavior of skin: Secondary | ICD-10-CM | POA: Diagnosis not present

## 2018-09-30 DIAGNOSIS — I10 Essential (primary) hypertension: Secondary | ICD-10-CM | POA: Diagnosis not present

## 2018-09-30 DIAGNOSIS — Z6822 Body mass index (BMI) 22.0-22.9, adult: Secondary | ICD-10-CM | POA: Diagnosis not present

## 2018-09-30 DIAGNOSIS — E2839 Other primary ovarian failure: Secondary | ICD-10-CM | POA: Diagnosis not present

## 2018-09-30 DIAGNOSIS — Z1331 Encounter for screening for depression: Secondary | ICD-10-CM | POA: Diagnosis not present

## 2018-09-30 DIAGNOSIS — Z1231 Encounter for screening mammogram for malignant neoplasm of breast: Secondary | ICD-10-CM | POA: Diagnosis not present

## 2018-09-30 DIAGNOSIS — Z79899 Other long term (current) drug therapy: Secondary | ICD-10-CM | POA: Diagnosis not present

## 2018-09-30 DIAGNOSIS — E781 Pure hyperglyceridemia: Secondary | ICD-10-CM | POA: Diagnosis not present

## 2018-10-15 DIAGNOSIS — D485 Neoplasm of uncertain behavior of skin: Secondary | ICD-10-CM | POA: Diagnosis not present

## 2018-12-02 DIAGNOSIS — J0120 Acute ethmoidal sinusitis, unspecified: Secondary | ICD-10-CM | POA: Diagnosis not present

## 2018-12-02 DIAGNOSIS — Z6823 Body mass index (BMI) 23.0-23.9, adult: Secondary | ICD-10-CM | POA: Diagnosis not present

## 2018-12-17 ENCOUNTER — Other Ambulatory Visit: Payer: Self-pay

## 2018-12-22 DIAGNOSIS — M8589 Other specified disorders of bone density and structure, multiple sites: Secondary | ICD-10-CM | POA: Diagnosis not present

## 2018-12-22 DIAGNOSIS — Z1231 Encounter for screening mammogram for malignant neoplasm of breast: Secondary | ICD-10-CM | POA: Diagnosis not present

## 2018-12-22 DIAGNOSIS — E2839 Other primary ovarian failure: Secondary | ICD-10-CM | POA: Diagnosis not present

## 2018-12-23 DIAGNOSIS — E049 Nontoxic goiter, unspecified: Secondary | ICD-10-CM | POA: Diagnosis not present

## 2018-12-26 DIAGNOSIS — E049 Nontoxic goiter, unspecified: Secondary | ICD-10-CM | POA: Diagnosis not present

## 2018-12-26 DIAGNOSIS — E042 Nontoxic multinodular goiter: Secondary | ICD-10-CM | POA: Diagnosis not present

## 2019-02-18 DIAGNOSIS — R3121 Asymptomatic microscopic hematuria: Secondary | ICD-10-CM | POA: Diagnosis not present

## 2019-02-18 DIAGNOSIS — N200 Calculus of kidney: Secondary | ICD-10-CM | POA: Diagnosis not present

## 2019-03-10 DIAGNOSIS — J0100 Acute maxillary sinusitis, unspecified: Secondary | ICD-10-CM | POA: Diagnosis not present

## 2019-03-19 DIAGNOSIS — D225 Melanocytic nevi of trunk: Secondary | ICD-10-CM | POA: Diagnosis not present

## 2019-03-19 DIAGNOSIS — L814 Other melanin hyperpigmentation: Secondary | ICD-10-CM | POA: Diagnosis not present

## 2019-03-19 DIAGNOSIS — D485 Neoplasm of uncertain behavior of skin: Secondary | ICD-10-CM | POA: Diagnosis not present

## 2019-03-19 DIAGNOSIS — L570 Actinic keratosis: Secondary | ICD-10-CM | POA: Diagnosis not present

## 2019-04-22 DIAGNOSIS — Z1331 Encounter for screening for depression: Secondary | ICD-10-CM | POA: Diagnosis not present

## 2019-04-22 DIAGNOSIS — I10 Essential (primary) hypertension: Secondary | ICD-10-CM | POA: Diagnosis not present

## 2019-04-22 DIAGNOSIS — Z6823 Body mass index (BMI) 23.0-23.9, adult: Secondary | ICD-10-CM | POA: Diagnosis not present

## 2019-07-01 DIAGNOSIS — D485 Neoplasm of uncertain behavior of skin: Secondary | ICD-10-CM | POA: Diagnosis not present

## 2019-07-01 DIAGNOSIS — L820 Inflamed seborrheic keratosis: Secondary | ICD-10-CM | POA: Diagnosis not present

## 2019-07-01 DIAGNOSIS — D225 Melanocytic nevi of trunk: Secondary | ICD-10-CM | POA: Diagnosis not present

## 2019-08-17 DIAGNOSIS — Z79899 Other long term (current) drug therapy: Secondary | ICD-10-CM | POA: Diagnosis not present

## 2019-08-17 DIAGNOSIS — Z1331 Encounter for screening for depression: Secondary | ICD-10-CM | POA: Diagnosis not present

## 2019-08-17 DIAGNOSIS — Z0000 Encounter for general adult medical examination without abnormal findings: Secondary | ICD-10-CM | POA: Diagnosis not present

## 2019-08-17 DIAGNOSIS — I10 Essential (primary) hypertension: Secondary | ICD-10-CM | POA: Diagnosis not present

## 2019-08-17 DIAGNOSIS — Z6823 Body mass index (BMI) 23.0-23.9, adult: Secondary | ICD-10-CM | POA: Diagnosis not present

## 2019-11-10 DIAGNOSIS — H6502 Acute serous otitis media, left ear: Secondary | ICD-10-CM | POA: Diagnosis not present

## 2019-11-19 DIAGNOSIS — L989 Disorder of the skin and subcutaneous tissue, unspecified: Secondary | ICD-10-CM | POA: Diagnosis not present

## 2019-11-19 DIAGNOSIS — Z23 Encounter for immunization: Secondary | ICD-10-CM | POA: Diagnosis not present

## 2019-11-19 DIAGNOSIS — W548XXA Other contact with dog, initial encounter: Secondary | ICD-10-CM | POA: Diagnosis not present

## 2019-11-19 DIAGNOSIS — Z6823 Body mass index (BMI) 23.0-23.9, adult: Secondary | ICD-10-CM | POA: Diagnosis not present

## 2019-12-31 DIAGNOSIS — L814 Other melanin hyperpigmentation: Secondary | ICD-10-CM | POA: Diagnosis not present

## 2019-12-31 DIAGNOSIS — D485 Neoplasm of uncertain behavior of skin: Secondary | ICD-10-CM | POA: Diagnosis not present

## 2019-12-31 DIAGNOSIS — D225 Melanocytic nevi of trunk: Secondary | ICD-10-CM | POA: Diagnosis not present

## 2019-12-31 DIAGNOSIS — L820 Inflamed seborrheic keratosis: Secondary | ICD-10-CM | POA: Diagnosis not present

## 2019-12-31 DIAGNOSIS — D1801 Hemangioma of skin and subcutaneous tissue: Secondary | ICD-10-CM | POA: Diagnosis not present

## 2019-12-31 DIAGNOSIS — L821 Other seborrheic keratosis: Secondary | ICD-10-CM | POA: Diagnosis not present

## 2020-01-01 DIAGNOSIS — Z1231 Encounter for screening mammogram for malignant neoplasm of breast: Secondary | ICD-10-CM | POA: Diagnosis not present

## 2020-01-13 DIAGNOSIS — D485 Neoplasm of uncertain behavior of skin: Secondary | ICD-10-CM | POA: Diagnosis not present

## 2020-02-01 IMAGING — MR MR ABDOMEN WO/W CM
12 of 17 series · 28 of 48 positions shown · IV contrast (12ml Multihance)
Comparison: Multiple exams, including 10/11/2017

CLINICAL DATA: Assessment of renal cystic lesions

EXAM:
MRI ABDOMEN WITHOUT AND WITH CONTRAST
TECHNIQUE: Multiplanar multisequence MR imaging of the abdomen was performed
both before and after the administration of intravenous contrast.
CONTRAST:  12mL MULTIHANCE GADOBENATE DIMEGLUMINE 529 MG/ML IV SOLN
Creatinine was obtained on site at [HOSPITAL] at [HOSPITAL].
Results: Creatinine 1.0 mg/dL.

[Series 3: T2 · coronal · 5.0mm · 1.56mm/px · 1 of 32 slices shown (1 of 3)]
[im 1/32]
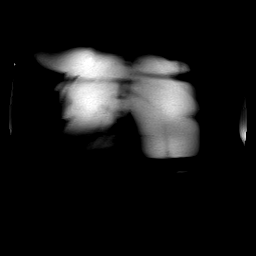

[Series 4: axial tru fisp · axial · 4.0mm · 1.45mm/px · 1 of 42 slices shown]
[im 1/42]
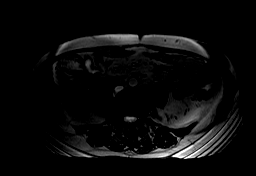

[Series 5: T2 · axial · 6.5mm · 0.70mm/px · 1 of 31 slices shown (2 of 3)]
[im 1/31]
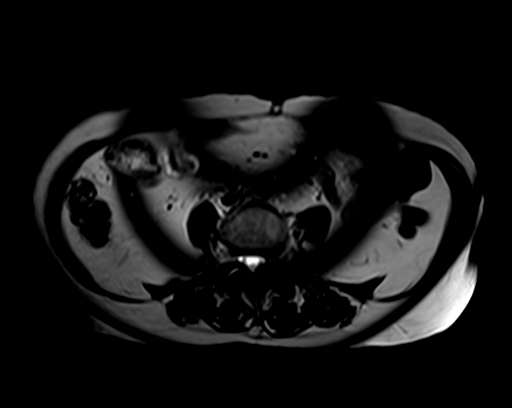

[Series 6: ep2d_[id number]_500_800_p2 · axial · 6.0mm · 1.98mm/px · z∈[-103,+131]mm · 3 of 91 slices shown]
[im 1/91]
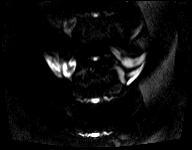
[im 46/91]
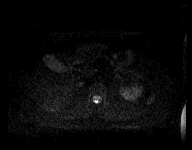
[im 91/91]
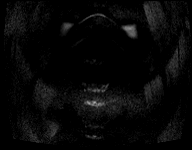

[Series 7: ep2d_[id number]_[id number]_adc · axial · 6.0mm · 1.98mm/px · 1 of 31 slices shown]
[im 1/31]
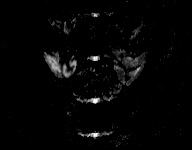

[Series 8: T2 · axial · 5.0mm · 1.33mm/px · 1 of 33 slices shown (3 of 3)]
[im 1/33]
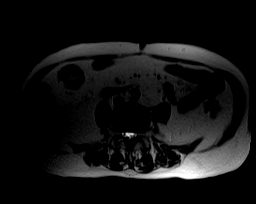

[Series 9: axial in out · axial · 5.5mm · 0.70mm/px · z∈[-104,+42]mm · 2 of 48 slices shown]
[im 1/48]
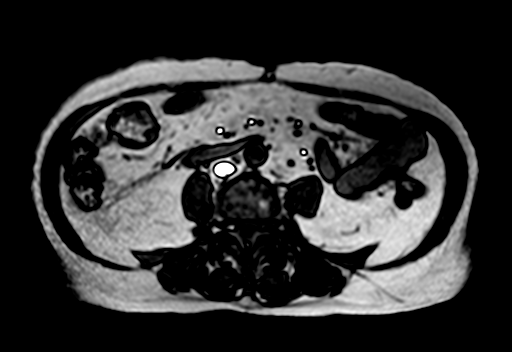
[im 48/48]
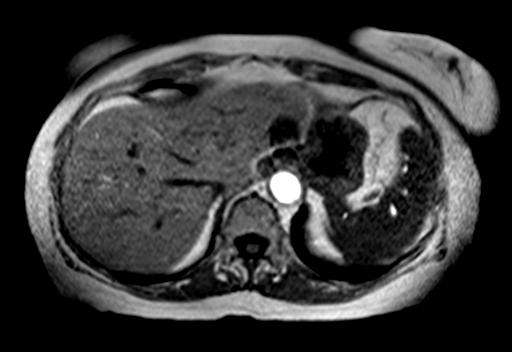

[Series 10: T1 dynamic · axial · non-contrast · 2.5mm · 0.66mm/px · z∈[-102,+135]mm · 4 of 96 slices shown]
[im 1/96]
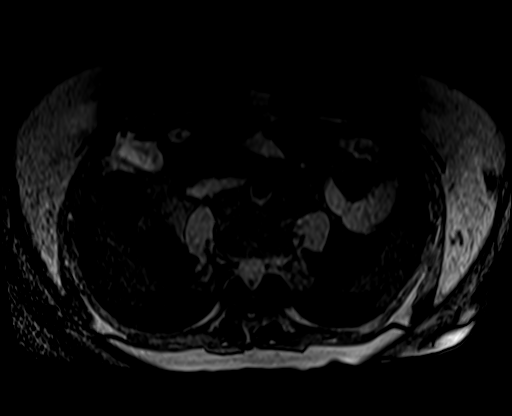
[im 32/96]
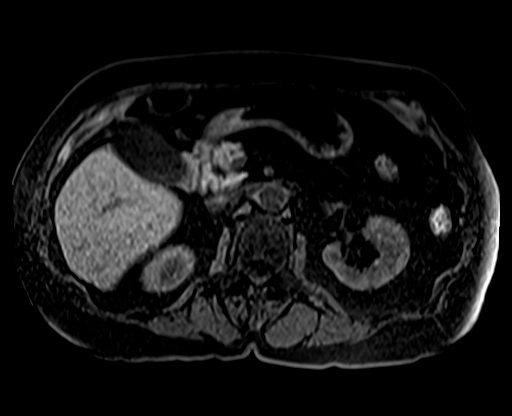
[im 64/96]
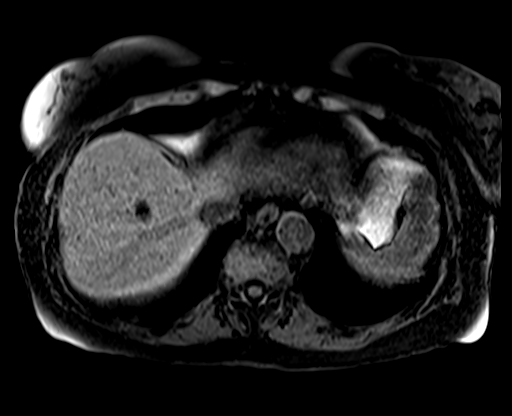
[im 96/96]
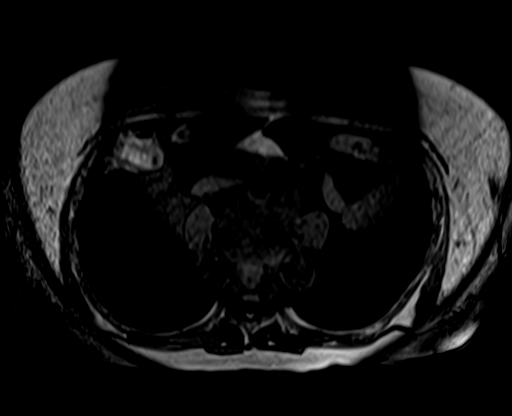

[Series 11: post 25 sec · axial · 2.5mm · 0.66mm/px · z∈[-102,+135]mm · 4 of 96 slices shown]
[im 1/96]
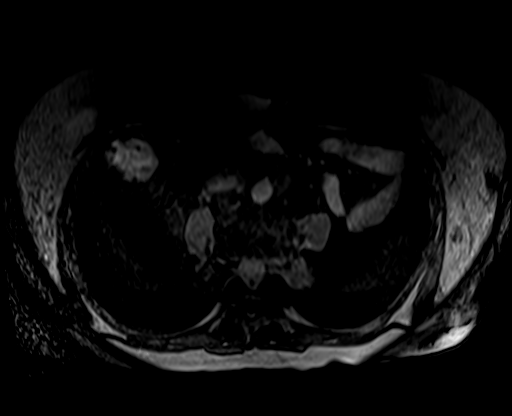
[im 32/96]
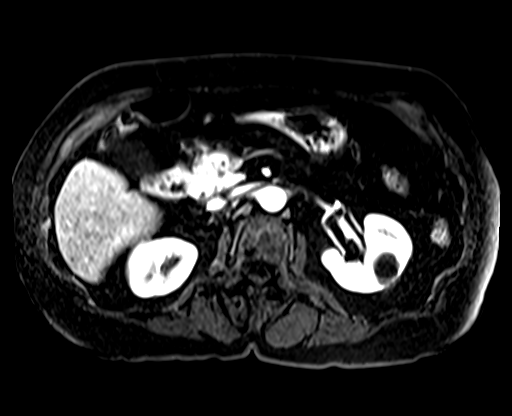
[im 64/96]
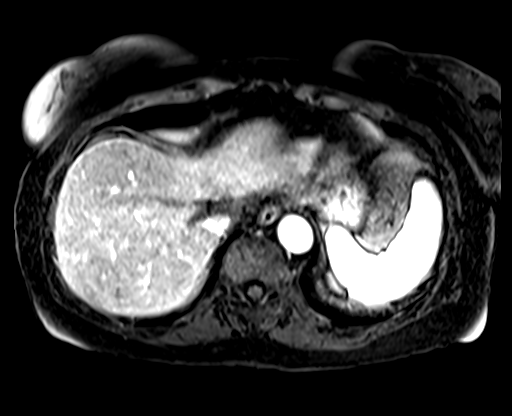
[im 96/96]
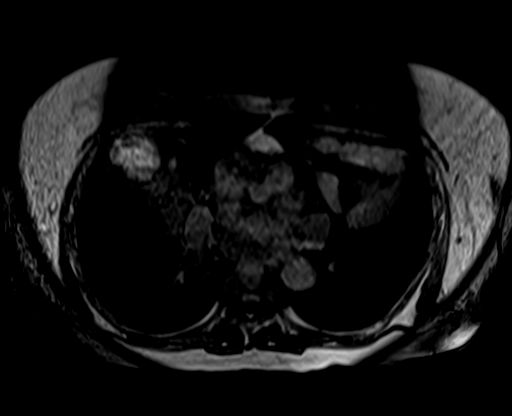

[Series 12: post 25 sec_sub · axial · 2.5mm · 0.66mm/px · z∈[-102,+135]mm · 4 of 96 slices shown]
[im 1/96]
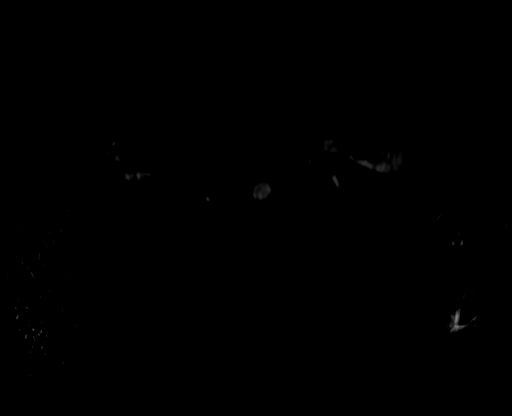
[im 32/96]
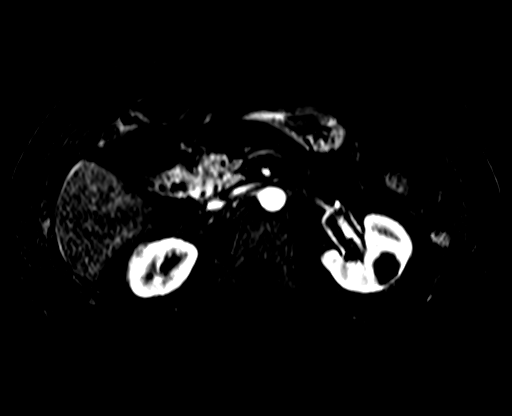
[im 64/96]
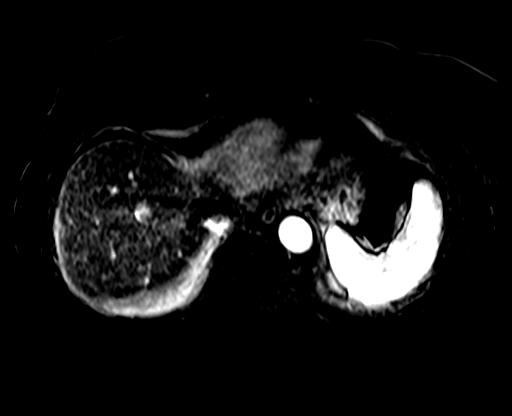
[im 96/96]
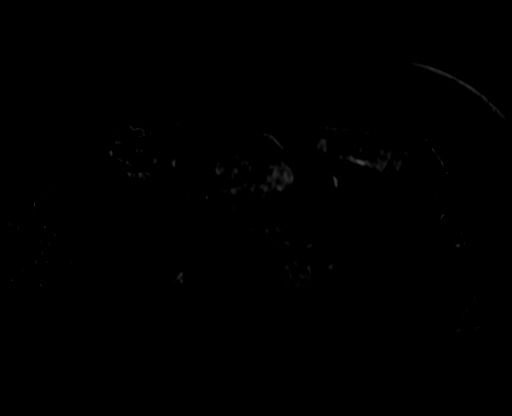

[Series 13: post 45 sec · axial · 2.5mm · 0.66mm/px · z∈[-102,+135]mm · 4 of 96 slices shown]
[im 1/96]
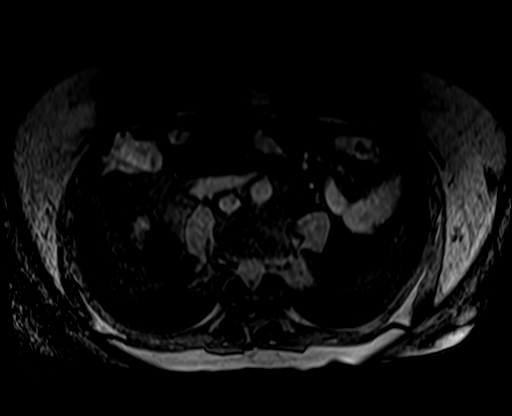
[im 32/96]
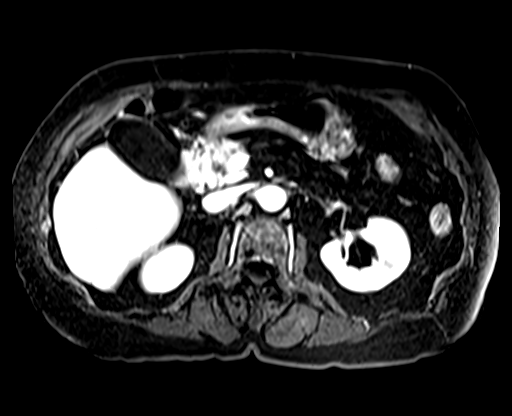
[im 64/96]
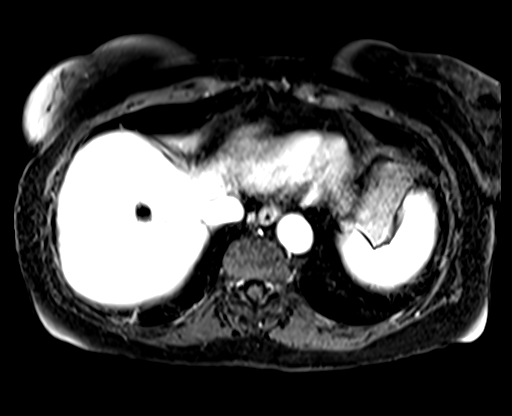
[im 96/96]
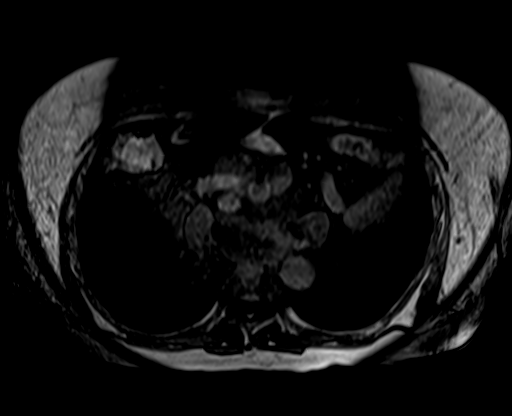

[Series 14: post 45 sec_sub · axial · 2.5mm · 0.66mm/px · z∈[-102,-25]mm · 2 of 96 slices shown]
[im 1/96]
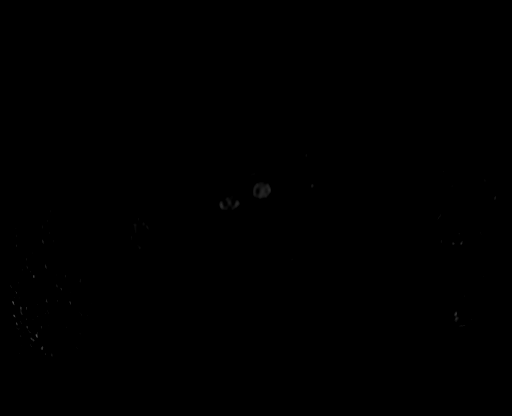
[im 32/96]
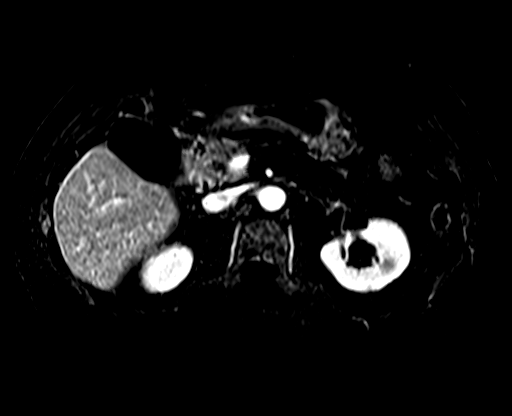

[28 of 48 positions shown; findings below may reference images not displayed]

FINDINGS: Lower chest: Unremarkable

Hepatobiliary: 1.1 cm cyst in the right hepatic lobe on image [DATE].
Mild hepatic steatosis. Gallbladder unremarkable. No biliary
dilatation.

Pancreas: 5 mm nonenhancing cystic lesion along the posterior margin
of the pancreatic tail on image [DATE]. Similar nonenhancing cystic
lesion along the anterior margin of the pancreatic head measuring
0.6 by 0.5 cm on image [DATE]. No abnormal enhancement of the
pancreatic parenchyma.

Spleen:  Unremarkable

Adrenals/Urinary Tract: Bilateral Bosniak category 1 and Bosniak
category 2 cysts are present in the kidneys. None of these lesions
demonstrate appreciable abnormal enhancement. Adrenal glands normal.

Stomach/Bowel: Scattered diverticula of the descending colon.

Vascular/Lymphatic:  Aortoiliac atherosclerotic vascular disease.

Other:  No supplemental non-categorized findings.

Musculoskeletal: Degenerative disc disease at L3-4 and L5-S1.
IMPRESSION: 1. The scattered renal lesions represent Bosniak category 1 and
category 2 benign cysts. No worrisome lesions. No further follow up
of the renal lesions is required.
2. Two small cystic lesions of the pancreas are identified. One is
present along the posterior margin of the pancreatic tail at 5 mm in
diameter on image [DATE], and appears is unchanged from 12/13/2004,
accordingly is felt to be benign. The other lesion measuring 5 by 6
mm is along the anterior margin of the pancreatic head on image
[DATE], not readily apparent on CT scans. This "H Murat Vapur" lesion is
not enhancing. Current guidelines for lesions of this size in this
patient's age group call for either no follow up, or a single follow
up pancreatic protocol MRI at 2 years time.
3. Mild hepatic steatosis.
4. Mild lumbar degenerative disc disease.
5.  Aortic Atherosclerosis ([id number]).

## 2020-02-17 DIAGNOSIS — R3121 Asymptomatic microscopic hematuria: Secondary | ICD-10-CM | POA: Diagnosis not present

## 2020-02-17 DIAGNOSIS — K862 Cyst of pancreas: Secondary | ICD-10-CM | POA: Diagnosis not present

## 2020-02-17 DIAGNOSIS — N281 Cyst of kidney, acquired: Secondary | ICD-10-CM | POA: Diagnosis not present

## 2020-02-24 ENCOUNTER — Other Ambulatory Visit: Payer: Self-pay | Admitting: Urology

## 2020-02-24 DIAGNOSIS — N281 Cyst of kidney, acquired: Secondary | ICD-10-CM

## 2020-02-24 DIAGNOSIS — K862 Cyst of pancreas: Secondary | ICD-10-CM

## 2020-03-04 ENCOUNTER — Other Ambulatory Visit: Payer: Self-pay | Admitting: Urology

## 2020-03-04 ENCOUNTER — Other Ambulatory Visit: Payer: Self-pay

## 2020-03-04 ENCOUNTER — Ambulatory Visit (HOSPITAL_COMMUNITY)
Admission: RE | Admit: 2020-03-04 | Discharge: 2020-03-04 | Disposition: A | Discharge status: Home / Self Care | Payer: PPO | Source: Ambulatory Visit | Attending: Urology | Admitting: Urology

## 2020-03-04 DIAGNOSIS — K862 Cyst of pancreas: Secondary | ICD-10-CM | POA: Diagnosis not present

## 2020-03-04 DIAGNOSIS — K8020 Calculus of gallbladder without cholecystitis without obstruction: Secondary | ICD-10-CM | POA: Diagnosis not present

## 2020-03-04 DIAGNOSIS — K5730 Diverticulosis of large intestine without perforation or abscess without bleeding: Secondary | ICD-10-CM | POA: Diagnosis not present

## 2020-03-04 DIAGNOSIS — N281 Cyst of kidney, acquired: Secondary | ICD-10-CM

## 2020-03-04 MED ORDER — GADOBUTROL 1 MMOL/ML IV SOLN
7.0000 mL | Freq: Once | INTRAVENOUS | Status: AC | PRN
  Administered 2020-03-04: 7 mL via INTRAVENOUS

## 2020-03-24 DIAGNOSIS — E049 Nontoxic goiter, unspecified: Secondary | ICD-10-CM | POA: Diagnosis not present

## 2020-06-15 DIAGNOSIS — M19041 Primary osteoarthritis, right hand: Secondary | ICD-10-CM | POA: Diagnosis not present

## 2020-06-15 DIAGNOSIS — M1811 Unilateral primary osteoarthritis of first carpometacarpal joint, right hand: Secondary | ICD-10-CM | POA: Diagnosis not present

## 2020-08-01 DIAGNOSIS — Z20828 Contact with and (suspected) exposure to other viral communicable diseases: Secondary | ICD-10-CM | POA: Diagnosis not present

## 2020-08-01 DIAGNOSIS — J069 Acute upper respiratory infection, unspecified: Secondary | ICD-10-CM | POA: Diagnosis not present

## 2020-08-06 DIAGNOSIS — J329 Chronic sinusitis, unspecified: Secondary | ICD-10-CM | POA: Diagnosis not present

## 2020-08-15 DIAGNOSIS — D225 Melanocytic nevi of trunk: Secondary | ICD-10-CM | POA: Diagnosis not present

## 2020-08-15 DIAGNOSIS — L821 Other seborrheic keratosis: Secondary | ICD-10-CM | POA: Diagnosis not present

## 2020-08-15 DIAGNOSIS — L814 Other melanin hyperpigmentation: Secondary | ICD-10-CM | POA: Diagnosis not present

## 2020-08-15 DIAGNOSIS — D485 Neoplasm of uncertain behavior of skin: Secondary | ICD-10-CM | POA: Diagnosis not present

## 2020-08-25 DIAGNOSIS — Z0000 Encounter for general adult medical examination without abnormal findings: Secondary | ICD-10-CM | POA: Diagnosis not present

## 2020-08-29 DIAGNOSIS — I10 Essential (primary) hypertension: Secondary | ICD-10-CM | POA: Diagnosis not present

## 2020-08-29 DIAGNOSIS — Z1331 Encounter for screening for depression: Secondary | ICD-10-CM | POA: Diagnosis not present

## 2020-08-29 DIAGNOSIS — Z0000 Encounter for general adult medical examination without abnormal findings: Secondary | ICD-10-CM | POA: Diagnosis not present

## 2020-08-29 DIAGNOSIS — Z6823 Body mass index (BMI) 23.0-23.9, adult: Secondary | ICD-10-CM | POA: Diagnosis not present

## 2020-09-30 DIAGNOSIS — M79641 Pain in right hand: Secondary | ICD-10-CM | POA: Diagnosis not present

## 2020-09-30 DIAGNOSIS — Z6820 Body mass index (BMI) 20.0-20.9, adult: Secondary | ICD-10-CM | POA: Diagnosis not present

## 2020-09-30 DIAGNOSIS — M150 Primary generalized (osteo)arthritis: Secondary | ICD-10-CM | POA: Diagnosis not present

## 2020-10-17 DIAGNOSIS — Z6821 Body mass index (BMI) 21.0-21.9, adult: Secondary | ICD-10-CM | POA: Diagnosis not present

## 2020-10-17 DIAGNOSIS — N3001 Acute cystitis with hematuria: Secondary | ICD-10-CM | POA: Diagnosis not present

## 2021-01-24 DIAGNOSIS — Z1231 Encounter for screening mammogram for malignant neoplasm of breast: Secondary | ICD-10-CM | POA: Diagnosis not present

## 2021-02-21 DIAGNOSIS — K862 Cyst of pancreas: Secondary | ICD-10-CM | POA: Diagnosis not present

## 2021-02-21 DIAGNOSIS — N8110 Cystocele, unspecified: Secondary | ICD-10-CM | POA: Diagnosis not present

## 2021-02-21 DIAGNOSIS — N281 Cyst of kidney, acquired: Secondary | ICD-10-CM | POA: Diagnosis not present

## 2021-02-21 DIAGNOSIS — N029 Recurrent and persistent hematuria with unspecified morphologic changes: Secondary | ICD-10-CM | POA: Diagnosis not present

## 2021-03-21 DIAGNOSIS — R1110 Vomiting, unspecified: Secondary | ICD-10-CM | POA: Diagnosis not present

## 2021-03-21 DIAGNOSIS — Z8719 Personal history of other diseases of the digestive system: Secondary | ICD-10-CM | POA: Diagnosis not present

## 2021-03-21 DIAGNOSIS — K810 Acute cholecystitis: Secondary | ICD-10-CM | POA: Diagnosis not present

## 2021-03-21 DIAGNOSIS — R109 Unspecified abdominal pain: Secondary | ICD-10-CM | POA: Diagnosis not present

## 2021-03-21 DIAGNOSIS — Z87891 Personal history of nicotine dependence: Secondary | ICD-10-CM | POA: Diagnosis not present

## 2021-03-21 DIAGNOSIS — I700 Atherosclerosis of aorta: Secondary | ICD-10-CM | POA: Diagnosis not present

## 2021-03-21 DIAGNOSIS — R1011 Right upper quadrant pain: Secondary | ICD-10-CM | POA: Diagnosis not present

## 2021-03-21 DIAGNOSIS — R509 Fever, unspecified: Secondary | ICD-10-CM | POA: Diagnosis not present

## 2021-03-21 DIAGNOSIS — R079 Chest pain, unspecified: Secondary | ICD-10-CM | POA: Diagnosis not present

## 2021-03-21 DIAGNOSIS — K9049 Malabsorption due to intolerance, not elsewhere classified: Secondary | ICD-10-CM | POA: Diagnosis not present

## 2021-03-21 DIAGNOSIS — R112 Nausea with vomiting, unspecified: Secondary | ICD-10-CM | POA: Diagnosis not present

## 2021-03-21 DIAGNOSIS — Z79899 Other long term (current) drug therapy: Secondary | ICD-10-CM | POA: Diagnosis not present

## 2021-03-21 DIAGNOSIS — J9811 Atelectasis: Secondary | ICD-10-CM | POA: Diagnosis not present

## 2021-03-21 DIAGNOSIS — K8000 Calculus of gallbladder with acute cholecystitis without obstruction: Secondary | ICD-10-CM | POA: Diagnosis not present

## 2021-03-21 DIAGNOSIS — I10 Essential (primary) hypertension: Secondary | ICD-10-CM | POA: Diagnosis not present

## 2021-03-21 DIAGNOSIS — Z880 Allergy status to penicillin: Secondary | ICD-10-CM | POA: Diagnosis not present

## 2021-03-29 DIAGNOSIS — Z09 Encounter for follow-up examination after completed treatment for conditions other than malignant neoplasm: Secondary | ICD-10-CM | POA: Diagnosis not present

## 2021-03-29 DIAGNOSIS — Z6820 Body mass index (BMI) 20.0-20.9, adult: Secondary | ICD-10-CM | POA: Diagnosis not present

## 2021-03-29 DIAGNOSIS — E876 Hypokalemia: Secondary | ICD-10-CM | POA: Diagnosis not present

## 2021-03-29 DIAGNOSIS — Z9049 Acquired absence of other specified parts of digestive tract: Secondary | ICD-10-CM | POA: Diagnosis not present

## 2021-04-20 DIAGNOSIS — E049 Nontoxic goiter, unspecified: Secondary | ICD-10-CM | POA: Diagnosis not present

## 2021-04-28 DIAGNOSIS — E049 Nontoxic goiter, unspecified: Secondary | ICD-10-CM | POA: Diagnosis not present

## 2021-04-28 DIAGNOSIS — E010 Iodine-deficiency related diffuse (endemic) goiter: Secondary | ICD-10-CM | POA: Diagnosis not present

## 2021-04-28 DIAGNOSIS — E041 Nontoxic single thyroid nodule: Secondary | ICD-10-CM | POA: Diagnosis not present

## 2021-05-25 DIAGNOSIS — H353131 Nonexudative age-related macular degeneration, bilateral, early dry stage: Secondary | ICD-10-CM | POA: Diagnosis not present

## 2021-05-25 DIAGNOSIS — H25813 Combined forms of age-related cataract, bilateral: Secondary | ICD-10-CM | POA: Diagnosis not present

## 2021-07-31 DIAGNOSIS — Z01818 Encounter for other preprocedural examination: Secondary | ICD-10-CM | POA: Diagnosis not present

## 2021-07-31 DIAGNOSIS — H25812 Combined forms of age-related cataract, left eye: Secondary | ICD-10-CM | POA: Diagnosis not present

## 2021-07-31 DIAGNOSIS — H25811 Combined forms of age-related cataract, right eye: Secondary | ICD-10-CM | POA: Diagnosis not present

## 2021-08-23 DIAGNOSIS — H25811 Combined forms of age-related cataract, right eye: Secondary | ICD-10-CM | POA: Diagnosis not present

## 2021-08-23 DIAGNOSIS — H269 Unspecified cataract: Secondary | ICD-10-CM | POA: Diagnosis not present

## 2021-09-12 DIAGNOSIS — Z6821 Body mass index (BMI) 21.0-21.9, adult: Secondary | ICD-10-CM | POA: Diagnosis not present

## 2021-09-12 DIAGNOSIS — Z1331 Encounter for screening for depression: Secondary | ICD-10-CM | POA: Diagnosis not present

## 2021-09-12 DIAGNOSIS — M8580 Other specified disorders of bone density and structure, unspecified site: Secondary | ICD-10-CM | POA: Diagnosis not present

## 2021-09-12 DIAGNOSIS — Z79899 Other long term (current) drug therapy: Secondary | ICD-10-CM | POA: Diagnosis not present

## 2021-09-12 DIAGNOSIS — Z0000 Encounter for general adult medical examination without abnormal findings: Secondary | ICD-10-CM | POA: Diagnosis not present

## 2021-09-12 DIAGNOSIS — E876 Hypokalemia: Secondary | ICD-10-CM | POA: Diagnosis not present

## 2021-09-12 DIAGNOSIS — I10 Essential (primary) hypertension: Secondary | ICD-10-CM | POA: Diagnosis not present

## 2021-09-20 DIAGNOSIS — H25812 Combined forms of age-related cataract, left eye: Secondary | ICD-10-CM | POA: Diagnosis not present

## 2021-09-20 DIAGNOSIS — H269 Unspecified cataract: Secondary | ICD-10-CM | POA: Diagnosis not present

## 2022-01-25 DIAGNOSIS — Z1231 Encounter for screening mammogram for malignant neoplasm of breast: Secondary | ICD-10-CM | POA: Diagnosis not present

## 2022-02-21 DIAGNOSIS — N029 Recurrent and persistent hematuria with unspecified morphologic changes: Secondary | ICD-10-CM | POA: Diagnosis not present

## 2022-04-03 DIAGNOSIS — M791 Myalgia, unspecified site: Secondary | ICD-10-CM | POA: Diagnosis not present

## 2022-04-17 DIAGNOSIS — J329 Chronic sinusitis, unspecified: Secondary | ICD-10-CM | POA: Diagnosis not present

## 2022-04-17 DIAGNOSIS — J40 Bronchitis, not specified as acute or chronic: Secondary | ICD-10-CM | POA: Diagnosis not present

## 2022-09-14 DIAGNOSIS — M8580 Other specified disorders of bone density and structure, unspecified site: Secondary | ICD-10-CM | POA: Diagnosis not present

## 2022-09-14 DIAGNOSIS — Z6821 Body mass index (BMI) 21.0-21.9, adult: Secondary | ICD-10-CM | POA: Diagnosis not present

## 2022-09-14 DIAGNOSIS — Z1339 Encounter for screening examination for other mental health and behavioral disorders: Secondary | ICD-10-CM | POA: Diagnosis not present

## 2022-09-14 DIAGNOSIS — Z0000 Encounter for general adult medical examination without abnormal findings: Secondary | ICD-10-CM | POA: Diagnosis not present

## 2022-09-14 DIAGNOSIS — I10 Essential (primary) hypertension: Secondary | ICD-10-CM | POA: Diagnosis not present

## 2022-09-21 DIAGNOSIS — J40 Bronchitis, not specified as acute or chronic: Secondary | ICD-10-CM | POA: Diagnosis not present

## 2022-09-21 DIAGNOSIS — J329 Chronic sinusitis, unspecified: Secondary | ICD-10-CM | POA: Diagnosis not present

## 2022-10-29 DIAGNOSIS — Z961 Presence of intraocular lens: Secondary | ICD-10-CM | POA: Diagnosis not present

## 2022-12-11 DIAGNOSIS — R350 Frequency of micturition: Secondary | ICD-10-CM | POA: Diagnosis not present

## 2022-12-11 DIAGNOSIS — Z1330 Encounter for screening examination for mental health and behavioral disorders, unspecified: Secondary | ICD-10-CM | POA: Diagnosis not present

## 2022-12-11 DIAGNOSIS — N029 Recurrent and persistent hematuria with unspecified morphologic changes: Secondary | ICD-10-CM | POA: Diagnosis not present

## 2023-01-28 DIAGNOSIS — Z1231 Encounter for screening mammogram for malignant neoplasm of breast: Secondary | ICD-10-CM | POA: Diagnosis not present

## 2023-02-13 DIAGNOSIS — Z1231 Encounter for screening mammogram for malignant neoplasm of breast: Secondary | ICD-10-CM | POA: Diagnosis not present

## 2023-05-28 DIAGNOSIS — J40 Bronchitis, not specified as acute or chronic: Secondary | ICD-10-CM | POA: Diagnosis not present

## 2023-05-28 DIAGNOSIS — Z6821 Body mass index (BMI) 21.0-21.9, adult: Secondary | ICD-10-CM | POA: Diagnosis not present

## 2023-05-28 DIAGNOSIS — J329 Chronic sinusitis, unspecified: Secondary | ICD-10-CM | POA: Diagnosis not present

## 2023-06-18 DIAGNOSIS — D225 Melanocytic nevi of trunk: Secondary | ICD-10-CM | POA: Diagnosis not present

## 2023-06-18 DIAGNOSIS — Z1283 Encounter for screening for malignant neoplasm of skin: Secondary | ICD-10-CM | POA: Diagnosis not present

## 2023-06-18 DIAGNOSIS — L821 Other seborrheic keratosis: Secondary | ICD-10-CM | POA: Diagnosis not present

## 2023-06-18 DIAGNOSIS — L820 Inflamed seborrheic keratosis: Secondary | ICD-10-CM | POA: Diagnosis not present

## 2023-07-24 DIAGNOSIS — Z6821 Body mass index (BMI) 21.0-21.9, adult: Secondary | ICD-10-CM | POA: Diagnosis not present

## 2023-07-24 DIAGNOSIS — S81812S Laceration without foreign body, left lower leg, sequela: Secondary | ICD-10-CM | POA: Diagnosis not present

## 2023-07-31 DIAGNOSIS — S81812S Laceration without foreign body, left lower leg, sequela: Secondary | ICD-10-CM | POA: Diagnosis not present

## 2023-07-31 DIAGNOSIS — Z6821 Body mass index (BMI) 21.0-21.9, adult: Secondary | ICD-10-CM | POA: Diagnosis not present

## 2023-09-16 DIAGNOSIS — Z1331 Encounter for screening for depression: Secondary | ICD-10-CM | POA: Diagnosis not present

## 2023-09-16 DIAGNOSIS — Z79899 Other long term (current) drug therapy: Secondary | ICD-10-CM | POA: Diagnosis not present

## 2023-09-16 DIAGNOSIS — Z6821 Body mass index (BMI) 21.0-21.9, adult: Secondary | ICD-10-CM | POA: Diagnosis not present

## 2023-09-16 DIAGNOSIS — Z0000 Encounter for general adult medical examination without abnormal findings: Secondary | ICD-10-CM | POA: Diagnosis not present

## 2023-09-16 DIAGNOSIS — Z1339 Encounter for screening examination for other mental health and behavioral disorders: Secondary | ICD-10-CM | POA: Diagnosis not present

## 2023-09-16 DIAGNOSIS — M8580 Other specified disorders of bone density and structure, unspecified site: Secondary | ICD-10-CM | POA: Diagnosis not present

## 2023-09-16 DIAGNOSIS — I10 Essential (primary) hypertension: Secondary | ICD-10-CM | POA: Diagnosis not present

## 2023-10-22 DIAGNOSIS — E876 Hypokalemia: Secondary | ICD-10-CM | POA: Diagnosis not present

## 2023-10-30 DIAGNOSIS — Z961 Presence of intraocular lens: Secondary | ICD-10-CM | POA: Diagnosis not present

## 2023-11-04 DIAGNOSIS — Z23 Encounter for immunization: Secondary | ICD-10-CM | POA: Diagnosis not present
